# Patient Record
Sex: Female | Born: 1995 | Race: White | Hispanic: No | Marital: Single | Smoking: Never smoker
Health system: Southern US, Community
[De-identification: ages and names within clinical notes are randomized; demographics above are authoritative.]

---
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
## 2017-12-21 ENCOUNTER — Emergency Department (HOSPITAL_COMMUNITY): Payer: 59

## 2017-12-21 ENCOUNTER — Encounter (HOSPITAL_COMMUNITY): Payer: Self-pay | Admitting: Emergency Medicine

## 2017-12-21 DIAGNOSIS — R0789 Other chest pain: Secondary | ICD-10-CM | POA: Diagnosis present

## 2017-12-21 NOTE — ED Triage Notes (Signed)
Pt comes in with complaints of left shoulder pain that radiates to her left breast.  Endorses the pains as sharp. Denies increase in pain when she takes deep breaths or moves. Denies any cardiac hx or SOB. Denies recent injury to arm.

## 2017-12-22 ENCOUNTER — Emergency Department (HOSPITAL_COMMUNITY)
Admission: EM | Admit: 2017-12-22 | Discharge: 2017-12-22 | Disposition: A | Discharge status: Home / Self Care | Payer: 59 | Attending: Emergency Medicine | Admitting: Emergency Medicine

## 2017-12-22 DIAGNOSIS — R0789 Other chest pain: Secondary | ICD-10-CM

## 2017-12-22 NOTE — ED Provider Notes (Signed)
[facility]-EMERGENCY DEPT Provider Note   CSN: [id number] Arrival date & time: 12/21/17  2240     History   Chief Complaint Chief Complaint  Patient presents with  . Shoulder Pain  . Chest Pain    HPI Robin Harper is a 22 y.o. female.  Patient presents to the emergency department for evaluation of chest pain.  Symptoms have been ongoing for 1 day.  Patient reports pain in the left chest area that occurs intermittently.  She reports that it occurs randomly and last for about 2 minutes.  Pain is sharp in nature.  It seems to be worse if she stands up or moves.  Sometimes the pain worsens with taking a deep breath but she is not short of breath.      History reviewed. No pertinent past medical history.  There are no active problems to display for this patient.   History reviewed. No pertinent surgical history.  OB History    No data available       Home Medications    Prior to Admission medications   Medication Sig Start Date End Date Taking? Authorizing Provider  ibuprofen (ADVIL,MOTRIN) 200 MG tablet Take 400 mg by mouth every 6 (six) hours as needed for headache, mild pain or moderate pain.   Yes [provider]    Family History No family history on file.  Social History Social History   Tobacco Use  . Smoking status: Never Smoker  . Smokeless tobacco: Never Used  Substance Use Topics  . Alcohol use: Yes    Comment: socially  . Drug use: No     Allergies   Patient has no known allergies.   Review of Systems Review of Systems  Respiratory: Negative for shortness of breath.   Cardiovascular: Positive for chest pain.  All other systems reviewed and are negative.    Physical Exam Updated Vital Signs BP 118/73 (BP Location: Right Arm)   Pulse 80   Temp 98.3 F (36.8 C) (Oral)   Resp 18   Ht 5\' 2"  (1.575 m)   Wt 59 kg (130 lb)   LMP 12/11/2017 (Approximate)   SpO2 100%   BMI 23.78 kg/m   Physical  Exam  Constitutional: She is oriented to person, place, and time. She appears well-developed and well-nourished. No distress.  HENT:  Head: Normocephalic and atraumatic.  Right Ear: Hearing normal.  Left Ear: Hearing normal.  Nose: Nose normal.  Mouth/Throat: Oropharynx is clear and moist and mucous membranes are normal.  Eyes: Conjunctivae and EOM are normal. Pupils are equal, round, and reactive to light.  Neck: Normal range of motion. Neck supple.  Cardiovascular: Regular rhythm, S1 normal and S2 normal. Exam reveals no gallop and no friction rub.  No murmur heard. Pulmonary/Chest: Effort normal and breath sounds normal. No respiratory distress. She exhibits tenderness.    Abdominal: Soft. Normal appearance and bowel sounds are normal. There is no hepatosplenomegaly. There is no tenderness. There is no rebound, no guarding, no tenderness at McBurney's point and negative Murphy's sign. No hernia.  Musculoskeletal: Normal range of motion.  Neurological: She is alert and oriented to person, place, and time. She has normal strength. No cranial nerve deficit or sensory deficit. Coordination normal. GCS eye subscore is 4. GCS verbal subscore is 5. GCS motor subscore is 6.  Skin: Skin is warm, dry and intact. No rash noted. No cyanosis.  Psychiatric: She has a normal mood and affect. Her speech is normal  and behavior is normal. Thought content normal.  Nursing note and vitals reviewed.    ED Treatments / Results  Labs (all labs ordered are listed, but only abnormal results are displayed) Labs Reviewed - No data to display  EKG  EKG Interpretation  Date/Time:  Saturday December 21 2017 23:30:57 EST Ventricular Rate:  87 PR Interval:    QRS Duration: 84 QT Interval:  354 QTC Calculation: 426 R Axis:   76 Text Interpretation:  Sinus rhythm Normal ECG Confirmed by Gilda CreasePollina, Christopher J [phone]([id number]) on 12/22/2017 12:18:00 AM       Radiology Dg Chest 2 View  Result Date:  12/22/2017 CLINICAL DATA:  Pleuritic left chest and shoulder pain. EXAM: CHEST  2 VIEW COMPARISON:  None. FINDINGS: The heart size and mediastinal contours are within normal limits. Both lungs are clear. No evidence of pneumothorax or pleural effusion. The visualized skeletal structures are unremarkable. IMPRESSION: Negative.  No active cardiopulmonary disease. Electronically Signed   By: Myles RosenthalJohn  Stahl M.D.   On: 12/22/2017 00:01    Procedures Procedures (including critical care time)  Medications Ordered in ED Medications - No data to display   Initial Impression / Assessment and Plan / ED Course  I have reviewed the triage vital signs and the nursing notes.  Pertinent labs & imaging results that were available during my care of the patient were reviewed by me and considered in my medical decision making (see chart for details).     Patient with no cardiac risk factors presents with atypical chest pain.  She is having intermittent episodes of sharp pain in the left lateral chest wall area.  This is reproducible with palpation.  She does not have any PE risk factors, PERC/Wells negative.   Final Clinical Impressions(s) / ED Diagnoses   Final diagnoses:  Chest wall pain    ED Discharge Orders    None       Gilda CreasePollina, Christopher J, MD 12/22/17 [id number]

## 2018-04-22 ENCOUNTER — Emergency Department (HOSPITAL_COMMUNITY): Payer: 59

## 2018-04-22 ENCOUNTER — Encounter (HOSPITAL_COMMUNITY): Payer: Self-pay

## 2018-04-22 DIAGNOSIS — R072 Precordial pain: Secondary | ICD-10-CM | POA: Diagnosis not present

## 2018-04-22 DIAGNOSIS — R0789 Other chest pain: Secondary | ICD-10-CM | POA: Diagnosis present

## 2018-04-22 NOTE — ED Triage Notes (Signed)
Pt complains of a sharp chest pain since 2130 tonight and being short of breath for two days

## 2018-04-23 ENCOUNTER — Emergency Department (HOSPITAL_COMMUNITY)
Admission: EM | Admit: 2018-04-23 | Discharge: 2018-04-23 | Disposition: A | Discharge status: Home / Self Care | Payer: 59 | Attending: Emergency Medicine | Admitting: Emergency Medicine

## 2018-04-23 DIAGNOSIS — R072 Precordial pain: Secondary | ICD-10-CM

## 2018-04-23 LAB — CBC
HCT: 37.1 % (ref 36.0–46.0)
HEMOGLOBIN: 11.7 g/dL — AB (ref 12.0–15.0)
MCH: 25.1 pg — AB (ref 26.0–34.0)
MCHC: 31.5 g/dL (ref 30.0–36.0)
MCV: 79.6 fL (ref 78.0–100.0)
PLATELETS: 252 10*3/uL (ref 150–400)
RBC: 4.66 MIL/uL (ref 3.87–5.11)
RDW: 16.5 % — ABNORMAL HIGH (ref 11.5–15.5)
WBC: 7.7 10*3/uL (ref 4.0–10.5)

## 2018-04-23 LAB — BASIC METABOLIC PANEL
Anion gap: 8 (ref 5–15)
BUN: 18 mg/dL (ref 6–20)
CALCIUM: 9.4 mg/dL (ref 8.9–10.3)
CHLORIDE: 108 mmol/L (ref 101–111)
CO2: 23 mmol/L (ref 22–32)
CREATININE: 0.66 mg/dL (ref 0.44–1.00)
GFR calc non Af Amer: >60 mL/min (ref >60)
GLUCOSE: 90 mg/dL (ref 65–99)
Potassium: 3.6 mmol/L (ref 3.5–5.1)
Sodium: 139 mmol/L (ref 135–145)

## 2018-04-23 LAB — I-STAT TROPONIN, ED: TROPONIN I, POC: 0 ng/mL (ref 0.00–0.08)

## 2018-04-23 LAB — I-STAT BETA HCG BLOOD, ED (MC, WL, AP ONLY): I-stat hCG, quantitative: <5 m[IU]/mL (ref <5)

## 2018-04-23 MED ORDER — IBUPROFEN 600 MG PO TABS: 600.0000 mg | ORAL_TABLET | Freq: Four times a day (QID) | ORAL | 0 refills | Status: AC | PRN

## 2018-04-23 MED ORDER — CYCLOBENZAPRINE HCL 10 MG PO TABS: 10.0000 mg | ORAL_TABLET | Freq: Two times a day (BID) | ORAL | 0 refills | Status: AC | PRN

## 2018-04-23 NOTE — ED Provider Notes (Signed)
[facility]-EMERGENCY DEPT Provider Note   CSN: [id number] Arrival date & time: 04/22/18  2251     History   Chief Complaint Chief Complaint  Patient presents with  . Chest Pain    HPI Robin Harper is a 22 y.o. female.  Patient with no pertinent past medical history presents to the emergency department with a brief episode of chest pain last night at around 9 PM.  She states that it lasted for about a minute.  She describes it as sharp and stabbing.  She denies any other associated symptoms, specifically fever, chills, cough, or shortness of breath.  She denies any history of PE or DVT.  Denies any recent travel or immobilization.  She has not taken anything for her symptoms.  The symptoms are worsened with palpation.  She states that she thinks she strained a muscle while taking out the garbage.  The history is provided by the patient. No language interpreter was used.    History reviewed. No pertinent past medical history.  There are no active problems to display for this patient.   History reviewed. No pertinent surgical history.   OB History   None      Home Medications    Prior to Admission medications   Medication Sig Start Date End Date Taking? Authorizing Provider  ibuprofen (ADVIL,MOTRIN) 200 MG tablet Take 400 mg by mouth every 6 (six) hours as needed for headache, mild pain or moderate pain.   Yes [provider]    Family History History reviewed. No pertinent family history.  Social History Social History   Tobacco Use  . Smoking status: Never Smoker  . Smokeless tobacco: Never Used  Substance Use Topics  . Alcohol use: Yes    Comment: socially  . Drug use: No     Allergies   Patient has no known allergies.   Review of Systems Review of Systems  All other systems reviewed and are negative.    Physical Exam Updated Vital Signs BP 112/71   Pulse 79   Temp 98.6 F (37 C) (Oral)   Resp 18   Ht 5'  2" (1.575 m)   Wt 66.2 kg (146 lb)   LMP 04/06/2018   SpO2 100%   BMI 26.70 kg/m   Physical Exam  Constitutional: She is oriented to person, place, and time. She appears well-developed and well-nourished.  HENT:  Head: Normocephalic and atraumatic.  Eyes: Pupils are equal, round, and reactive to light. Conjunctivae and EOM are normal.  Neck: Normal range of motion. Neck supple.  Cardiovascular: Normal rate and regular rhythm. Exam reveals no gallop and no friction rub.  No murmur heard. Pulmonary/Chest: Effort normal and breath sounds normal. No respiratory distress. She has no wheezes. She has no rales. She exhibits no tenderness.  Left upper chest wall and pectoralis major tender to palpation  Abdominal: Soft. Bowel sounds are normal. She exhibits no distension and no mass. There is no tenderness. There is no rebound and no guarding.  Musculoskeletal: Normal range of motion. She exhibits no edema or tenderness.  Neurological: She is alert and oriented to person, place, and time.  Skin: Skin is warm and dry.  No rash  Psychiatric: She has a normal mood and affect. Her behavior is normal. Judgment and thought content normal.  Nursing note and vitals reviewed.    ED Treatments / Results  Labs (all labs ordered are listed, but only abnormal results are displayed) Labs Reviewed  CBC -  Abnormal; Notable for the following components:      Result Value   Hemoglobin 11.7 (*)    MCH 25.1 (*)    RDW 16.5 (*)    All other components within normal limits  BASIC METABOLIC PANEL  I-STAT TROPONIN, ED  I-STAT BETA HCG BLOOD, ED ([facility], [facility], [facility] ONLY)    EKG None ED ECG REPORT  I personally interpreted this EKG   Date: 04/23/2018   Rate: 77  Rhythm: premature atrial contractions (PAC)  QRS Axis: normal  Intervals: normal  ST/T Wave abnormalities: normal  Conduction Disutrbances:none  Narrative Interpretation:   Old EKG Reviewed: none available   Radiology Dg Chest 2 View  Result  Date: 04/22/2018 CLINICAL DATA:  Acute onset of left upper chest wall pain. EXAM: CHEST - 2 VIEW COMPARISON:  Chest radiograph performed 12/21/2017 FINDINGS: The lungs are well-aerated and clear. There is no evidence of focal opacification, pleural effusion or pneumothorax. The heart is normal in size; the mediastinal contour is within normal limits. No acute osseous abnormalities are seen. IMPRESSION: No acute cardiopulmonary process seen. Electronically Signed   By: Roanna Raider M.D.   On: 04/22/2018 23:42    Procedures Procedures (including critical care time)  Medications Ordered in ED Medications - No data to display   Initial Impression / Assessment and Plan / ED Course  I have reviewed the triage vital signs and the nursing notes.  Pertinent labs & imaging results that were available during my care of the patient were reviewed by me and considered in my medical decision making (see chart for details).     Patient with sharp stabbing twinges of chest pain.  Chest x-ray is negative.  Troponin negative.  Doubt infection, doubt ACS.  She is thought to be low risk for PE, as she is PERC negative.  The symptoms are reproducible with palpation of the upper chest wall.  Will discharge home with anti-inflammatory and muscle relaxer.  Recommend PCP follow-up.  Final Clinical Impressions(s) / ED Diagnoses   Final diagnoses:  Precordial pain    ED Discharge Orders        Ordered    ibuprofen (ADVIL,MOTRIN) 600 MG tablet  Every 6 hours PRN     04/23/18 0519    cyclobenzaprine (FLEXERIL) 10 MG tablet  2 times daily PRN     04/23/18 0519       Roxy Horseman, PA-C 04/23/18 0519    Molpus, Jonny Ruiz, MD 04/23/18 [phone]

## 2019-01-19 IMAGING — CR DG CHEST 2V
2 series · 2 of 2 positions shown · non-contrast
Comparison: Chest radiograph performed 12/21/2017

CLINICAL DATA: Acute onset of left upper chest wall pain.

EXAM:
CHEST - 2 VIEW

[w chest pa]
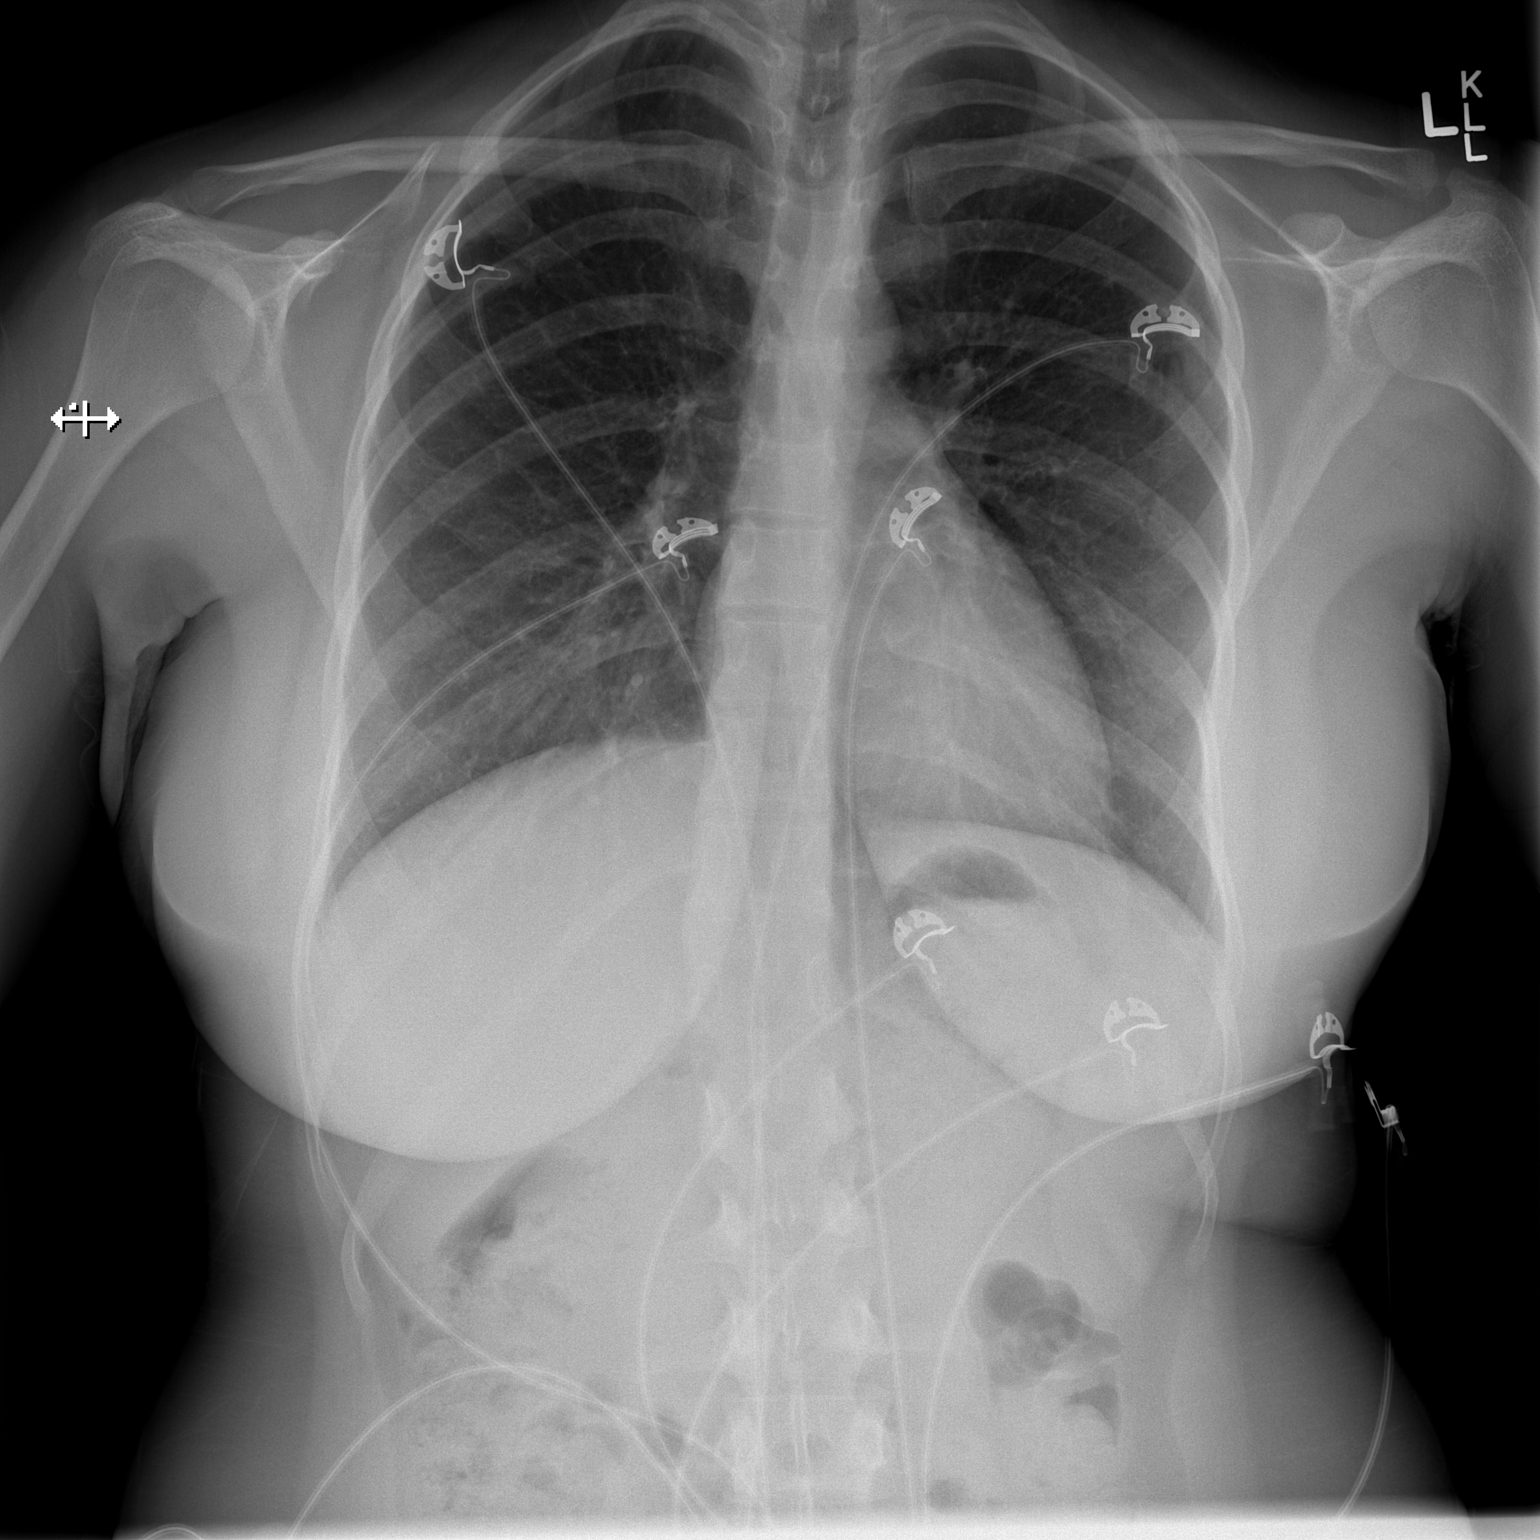

[w chest lat]
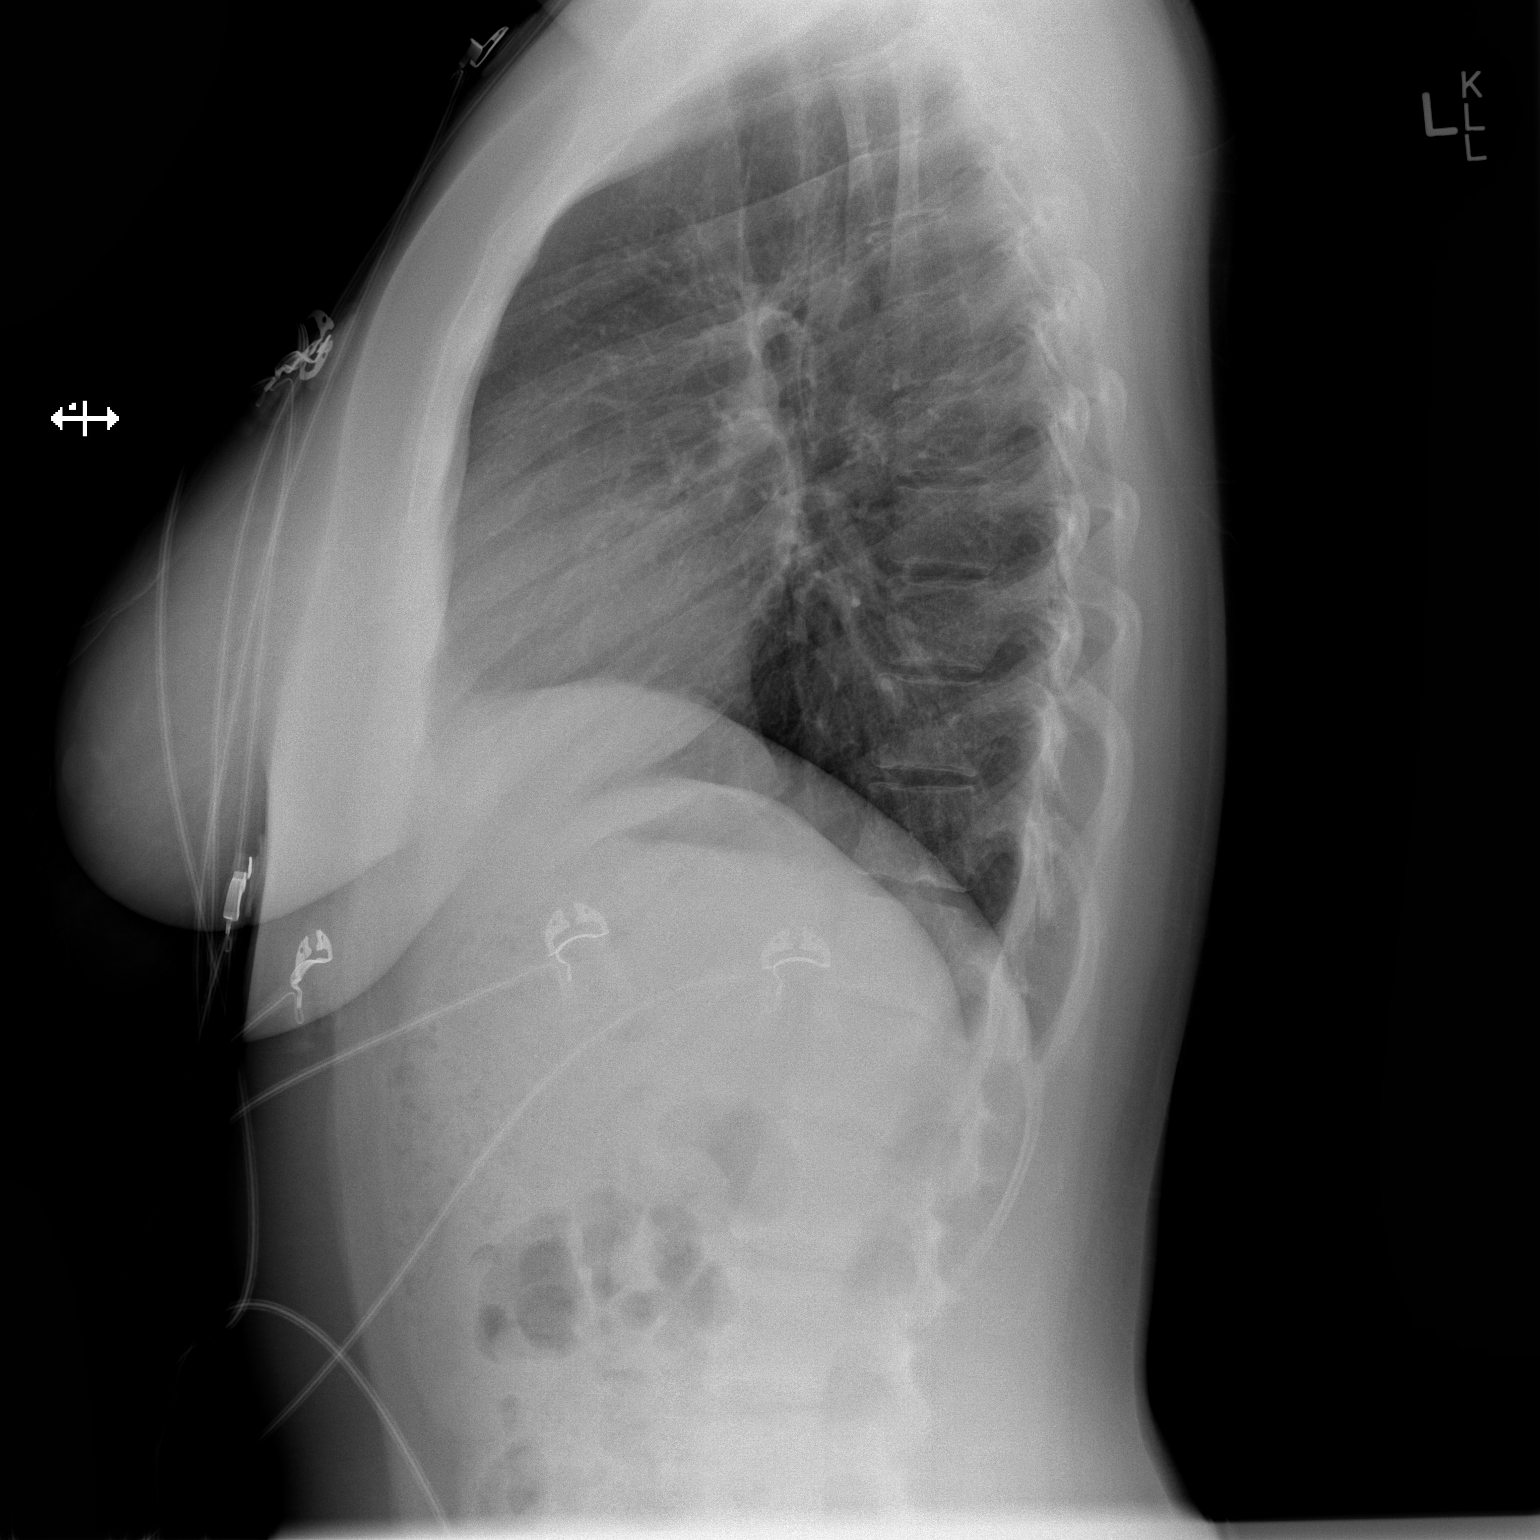

[2 of 2 positions shown; findings below may reference images not displayed]

FINDINGS: The lungs are well-aerated and clear. There is no evidence of focal
opacification, pleural effusion or pneumothorax.

The heart is normal in size; the mediastinal contour is within
normal limits. No acute osseous abnormalities are seen.
IMPRESSION: No acute cardiopulmonary process seen.

## 2020-02-27 ENCOUNTER — Ambulatory Visit: Payer: 59 | Attending: Internal Medicine

## 2020-02-27 DIAGNOSIS — Z23 Encounter for immunization: Secondary | ICD-10-CM

## 2020-02-27 NOTE — Progress Notes (Signed)
   Covid-19 Vaccination Clinic  Name:  KENTLEY CEDILLO    MRN: [id number] DOB: 1996-09-13  02/27/2020  Ms. Baus was observed post Covid-19 immunization for 15 minutes without incident. She was provided with Vaccine Information Sheet and instruction to access the V-Safe system.   Ms. Pavlicek was instructed to call 911 with any severe reactions post vaccine: Marland Kitchen Difficulty breathing  . Swelling of face and throat  . A fast heartbeat  . A bad rash all over body  . Dizziness and weakness   Immunizations Administered    Name Date Dose VIS Date Route   Pfizer COVID-19 Vaccine 02/27/2020  3:54 PM 0.3 mL 11/06/2019 Intramuscular   Manufacturer: [employer], [employer]   Lot: [id number]   NDC: [address]-1

## 2020-03-22 ENCOUNTER — Ambulatory Visit: Payer: 59 | Attending: Internal Medicine

## 2020-03-22 DIAGNOSIS — Z23 Encounter for immunization: Secondary | ICD-10-CM

## 2020-03-22 NOTE — Progress Notes (Signed)
   Covid-19 Vaccination Clinic  Name:  Robin Harper    MRN: [id number] DOB: October 25, 1996  03/22/2020  Robin Harper was observed post Covid-19 immunization for 15 minutes without incident. She was provided with Vaccine Information Sheet and instruction to access the V-Safe system.   Robin Harper was instructed to call 911 with any severe reactions post vaccine: Marland Kitchen Difficulty breathing  . Swelling of face and throat  . A fast heartbeat  . A bad rash all over body  . Dizziness and weakness   Immunizations Administered    Name Date Dose VIS Date Route   Pfizer COVID-19 Vaccine 03/22/2020  2:25 PM 0.3 mL 01/20/2019 Intramuscular   Manufacturer: [employer], [employer]   Lot: [id number]   NDC: [address]-6

## 2022-12-30 DIAGNOSIS — N750 Cyst of Bartholin's gland: Secondary | ICD-10-CM | POA: Diagnosis not present

## 2023-01-04 DIAGNOSIS — N764 Abscess of vulva: Secondary | ICD-10-CM | POA: Diagnosis not present

## 2023-04-26 ENCOUNTER — Other Ambulatory Visit (HOSPITAL_COMMUNITY)
Admission: RE | Admit: 2023-04-26 | Discharge: 2023-04-26 | Disposition: A | Discharge status: Home / Self Care | Payer: BC Managed Care – PPO | Source: Ambulatory Visit | Attending: Family Medicine | Admitting: Family Medicine

## 2023-04-26 ENCOUNTER — Other Ambulatory Visit: Payer: Self-pay | Admitting: Family Medicine

## 2023-04-26 DIAGNOSIS — Z01411 Encounter for gynecological examination (general) (routine) with abnormal findings: Secondary | ICD-10-CM | POA: Insufficient documentation

## 2023-04-26 DIAGNOSIS — Z13220 Encounter for screening for lipoid disorders: Secondary | ICD-10-CM | POA: Diagnosis not present

## 2023-04-26 DIAGNOSIS — Z79899 Other long term (current) drug therapy: Secondary | ICD-10-CM | POA: Diagnosis not present

## 2023-04-26 DIAGNOSIS — Z124 Encounter for screening for malignant neoplasm of cervix: Secondary | ICD-10-CM | POA: Diagnosis not present

## 2023-04-26 DIAGNOSIS — Z0000 Encounter for general adult medical examination without abnormal findings: Secondary | ICD-10-CM | POA: Diagnosis not present

## 2023-04-30 LAB — CYTOLOGY - PAP: Diagnosis: NEGATIVE

## 2023-06-21 DIAGNOSIS — U071 COVID-19: Secondary | ICD-10-CM | POA: Diagnosis not present

## 2024-02-05 DIAGNOSIS — R21 Rash and other nonspecific skin eruption: Secondary | ICD-10-CM | POA: Diagnosis not present

## 2024-02-13 DIAGNOSIS — N939 Abnormal uterine and vaginal bleeding, unspecified: Secondary | ICD-10-CM | POA: Diagnosis not present

## 2024-02-14 DIAGNOSIS — N939 Abnormal uterine and vaginal bleeding, unspecified: Secondary | ICD-10-CM | POA: Diagnosis not present

## 2024-02-21 DIAGNOSIS — N921 Excessive and frequent menstruation with irregular cycle: Secondary | ICD-10-CM | POA: Diagnosis not present

## 2024-03-02 DIAGNOSIS — M791 Myalgia, unspecified site: Secondary | ICD-10-CM | POA: Diagnosis not present

## 2024-03-02 DIAGNOSIS — R202 Paresthesia of skin: Secondary | ICD-10-CM | POA: Diagnosis not present

## 2024-05-13 DIAGNOSIS — Z13220 Encounter for screening for lipoid disorders: Secondary | ICD-10-CM | POA: Diagnosis not present

## 2024-05-13 DIAGNOSIS — Z79899 Other long term (current) drug therapy: Secondary | ICD-10-CM | POA: Diagnosis not present

## 2024-07-13 DIAGNOSIS — Z23 Encounter for immunization: Secondary | ICD-10-CM | POA: Diagnosis not present

## 2024-10-04 DIAGNOSIS — J029 Acute pharyngitis, unspecified: Secondary | ICD-10-CM | POA: Diagnosis not present

## 2024-10-04 DIAGNOSIS — J069 Acute upper respiratory infection, unspecified: Secondary | ICD-10-CM | POA: Diagnosis not present
